# Patient Record
Sex: Female | Born: 1985 | Race: Black or African American | Hispanic: No | Marital: Single | Smoking: Never smoker
Health system: Southern US, Community
[De-identification: ages and names within clinical notes are randomized; demographics above are authoritative.]

## PROBLEM LIST (undated history)

## (undated) DIAGNOSIS — F411 Generalized anxiety disorder: Secondary | ICD-10-CM

## (undated) DIAGNOSIS — E785 Hyperlipidemia, unspecified: Secondary | ICD-10-CM

## (undated) DIAGNOSIS — Z789 Other specified health status: Secondary | ICD-10-CM

## (undated) HISTORY — DX: Generalized anxiety disorder: F41.1

## (undated) HISTORY — DX: Other specified health status: Z78.9

## (undated) HISTORY — DX: Hyperlipidemia, unspecified: E78.5

---

## 2021-05-04 ENCOUNTER — Emergency Department (HOSPITAL_BASED_OUTPATIENT_CLINIC_OR_DEPARTMENT_OTHER): Payer: Commercial Managed Care - PPO

## 2021-05-04 ENCOUNTER — Other Ambulatory Visit: Payer: Self-pay

## 2021-05-04 ENCOUNTER — Encounter (HOSPITAL_BASED_OUTPATIENT_CLINIC_OR_DEPARTMENT_OTHER): Payer: Self-pay

## 2021-05-04 ENCOUNTER — Emergency Department (HOSPITAL_BASED_OUTPATIENT_CLINIC_OR_DEPARTMENT_OTHER)
Admission: EM | Admit: 2021-05-04 | Discharge: 2021-05-04 | Disposition: A | Discharge status: Home / Self Care | Payer: Commercial Managed Care - PPO | Attending: Emergency Medicine | Admitting: Emergency Medicine

## 2021-05-04 DIAGNOSIS — R102 Pelvic and perineal pain: Secondary | ICD-10-CM | POA: Diagnosis not present

## 2021-05-04 DIAGNOSIS — R1031 Right lower quadrant pain: Secondary | ICD-10-CM | POA: Diagnosis present

## 2021-05-04 DIAGNOSIS — N898 Other specified noninflammatory disorders of vagina: Secondary | ICD-10-CM | POA: Diagnosis not present

## 2021-05-04 LAB — URINALYSIS, ROUTINE W REFLEX MICROSCOPIC
Bilirubin Urine: NEGATIVE
Glucose, UA: NEGATIVE mg/dL
Ketones, ur: 40 mg/dL — AB
Leukocytes,Ua: NEGATIVE
Nitrite: NEGATIVE
Protein, ur: NEGATIVE mg/dL
Specific Gravity, Urine: 1.025 (ref 1.005–1.030)
pH: 5.5 (ref 5.0–8.0)

## 2021-05-04 LAB — WET PREP, GENITAL
Clue Cells Wet Prep HPF POC: NONE SEEN
Sperm: NONE SEEN
Trich, Wet Prep: NONE SEEN
Yeast Wet Prep HPF POC: NONE SEEN

## 2021-05-04 LAB — URINALYSIS, MICROSCOPIC (REFLEX): WBC, UA: NONE SEEN WBC/hpf (ref 0–5)

## 2021-05-04 LAB — COMPREHENSIVE METABOLIC PANEL
ALT: 16 U/L (ref 0–44)
AST: 19 U/L (ref 15–41)
Albumin: 4.6 g/dL (ref 3.5–5.0)
Alkaline Phosphatase: 62 U/L (ref 38–126)
Anion gap: 9 (ref 5–15)
BUN: 10 mg/dL (ref 6–20)
CO2: 25 mmol/L (ref 22–32)
Calcium: 9.5 mg/dL (ref 8.9–10.3)
Chloride: 101 mmol/L (ref 98–111)
Creatinine, Ser: 0.81 mg/dL (ref 0.44–1.00)
GFR, Estimated: >60 mL/min (ref >60)
Glucose, Bld: 95 mg/dL (ref 70–99)
Potassium: 3.8 mmol/L (ref 3.5–5.1)
Sodium: 135 mmol/L (ref 135–145)
Total Bilirubin: 0.7 mg/dL (ref 0.3–1.2)
Total Protein: 8.3 g/dL — ABNORMAL HIGH (ref 6.5–8.1)

## 2021-05-04 LAB — CBC
HCT: 40.2 % (ref 36.0–46.0)
Hemoglobin: 12.9 g/dL (ref 12.0–15.0)
MCH: 27.7 pg (ref 26.0–34.0)
MCHC: 32.1 g/dL (ref 30.0–36.0)
MCV: 86.5 fL (ref 80.0–100.0)
Platelets: 283 10*3/uL (ref 150–400)
RBC: 4.65 MIL/uL (ref 3.87–5.11)
RDW: 12.7 % (ref 11.5–15.5)
WBC: 12 10*3/uL — ABNORMAL HIGH (ref 4.0–10.5)
nRBC: 0 % (ref 0.0–0.2)

## 2021-05-04 LAB — PREGNANCY, URINE: Preg Test, Ur: NEGATIVE

## 2021-05-04 LAB — LIPASE, BLOOD: Lipase: 24 U/L (ref 11–51)

## 2021-05-04 MED ORDER — HYDROCODONE-ACETAMINOPHEN 5-325 MG PO TABS: 1.0000 | ORAL_TABLET | Freq: Four times a day (QID) | ORAL | 0 refills | Status: DC | PRN

## 2021-05-04 NOTE — ED Triage Notes (Signed)
Pt abd pain x 3 days-denies n/v/d-mucus stool x 1 today -NAD-steady gait

## 2021-05-04 NOTE — ED Provider Notes (Addendum)
[facility] EMERGENCY DEPARTMENT Provider Note   CSN: [id number] Arrival date & time: 05/04/21  1515     History Chief Complaint  Patient presents with   Abdominal Pain    Kathleen Tanner is a 35 y.o. female.  HPI Patient is a 35 year old female who presents to the emergency department due to lower abdominal pain.  States her symptoms started about 3 days ago.  States that they are initially mild but have been progressively worsening.  Reports intermittent bouts of sharp pain in the pelvic region.  No known modifying factors.  Denies any nausea, vomiting, or diarrhea.  States that she had a normal bowel movement earlier today and noticed mucus when wiping.  Reports vaginal discharge but states that she believes this is her baseline discharge and has not acutely worsened.  Denies any dysuria or hematuria.  No fevers or chills.  Last menstrual period was about 2 weeks ago and was normal.  Denies any sexual activity for the past year.    History reviewed. No pertinent past medical history.  There are no problems to display for this patient.   Past Surgical History:  Procedure Laterality Date   CESAREAN SECTION       OB History   No obstetric history on file.     No family history on file.  Social History   Tobacco Use   Smoking status: Never   Smokeless tobacco: Never  Vaping Use   Vaping Use: Never used  Substance Use Topics   Alcohol use: Yes    Comment: daily   Drug use: Never    Home Medications Prior to Admission medications   Medication Sig Start Date End Date Taking? Authorizing Provider  HYDROcodone-acetaminophen (NORCO/VICODIN) 5-325 MG tablet Take 1 tablet by mouth every 6 (six) hours as needed. 05/04/21  Yes Placido Sou, PA-C    Allergies    Ciprofloxacin, Naproxen, and Tape  Review of Systems   Review of Systems  All other systems reviewed and are negative. Ten systems reviewed and are negative for acute change, except as noted in the  HPI.   Physical Exam Updated Vital Signs BP (!) 144/97 (BP Location: Right Arm)   Pulse 69   Temp 98.4 F (36.9 C) (Oral)   Resp 20   Ht 5' 0.5" (1.537 m)   Wt 73.5 kg   LMP 04/21/2021   SpO2 100%   BMI 31.12 kg/m   Physical Exam Vitals and nursing note reviewed.  Constitutional:      General: She is not in acute distress.    Appearance: Normal appearance. She is well-developed. She is not ill-appearing, toxic-appearing or diaphoretic.  HENT:     Head: Normocephalic and atraumatic.     Right Ear: External ear normal.     Left Ear: External ear normal.     Nose: Nose normal.     Mouth/Throat:     Mouth: Mucous membranes are moist.     Pharynx: Oropharynx is clear. No oropharyngeal exudate or posterior oropharyngeal erythema.  Eyes:     Extraocular Movements: Extraocular movements intact.  Cardiovascular:     Rate and Rhythm: Normal rate and regular rhythm.     Pulses: Normal pulses.     Heart sounds: Normal heart sounds. No murmur heard.   No friction rub. No gallop.  Pulmonary:     Effort: Pulmonary effort is normal. No respiratory distress.     Breath sounds: Normal breath sounds. No stridor. No wheezing, rhonchi or rales.  Abdominal:     General: Abdomen is flat.     Palpations: Abdomen is soft.     Tenderness: There is abdominal tenderness in the right lower quadrant, suprapubic area and left lower quadrant.     Comments: Abdomen is flat and soft.  Moderate tenderness noted diffusely across the lower abdomen that appears to be worst along the left lower quadrant.  Genitourinary:    Comments: Female nursing chaperone present.  Normal-appearing vulvar anatomy.  No rashes or lesions.  Normal-appearing vaginal mucosa.  Closed cervical os.  Moderate amount of white discharge noted in the vaginal vault.  No increased friability noted along the cervix.  No cervical motion tenderness.  No adnexal tenderness. Musculoskeletal:        General: Normal range of motion.      Cervical back: Normal range of motion and neck supple. No tenderness.  Skin:    General: Skin is warm and dry.  Neurological:     General: No focal deficit present.     Mental Status: She is alert and oriented to person, place, and time.  Psychiatric:        Mood and Affect: Mood normal.        Behavior: Behavior normal.    ED Results / Procedures / Treatments   Labs (all labs ordered are listed, but only abnormal results are displayed) Labs Reviewed  WET PREP, GENITAL - Abnormal; Notable for the following components:      Result Value   WBC, Wet Prep HPF POC MANY (*)    All other components within normal limits  COMPREHENSIVE METABOLIC PANEL - Abnormal; Notable for the following components:   Total Protein 8.3 (*)    All other components within normal limits  CBC - Abnormal; Notable for the following components:   WBC 12.0 (*)    All other components within normal limits  URINALYSIS, ROUTINE W REFLEX MICROSCOPIC - Abnormal; Notable for the following components:   Hgb urine dipstick SMALL (*)    Ketones, ur 40 (*)    All other components within normal limits  URINALYSIS, MICROSCOPIC (REFLEX) - Abnormal; Notable for the following components:   Bacteria, UA RARE (*)    All other components within normal limits  LIPASE, BLOOD  PREGNANCY, URINE  GC/CHLAMYDIA PROBE AMP ([place]) NOT AT [facility]   EKG None  Radiology US PELVIC COMPLETE W TRANSVAGINAL AND TORSION R/O  Result Date: 05/04/2021 CLINICAL DATA:  Left-sided pelvic pain EXAM: TRANSABDOMINAL AND TRANSVAGINAL ULTRASOUND OF PELVIS DOPPLER ULTRASOUND OF OVARIES TECHNIQUE: Both transabdominal and transvaginal ultrasound examinations of the pelvis were performed. Transabdominal technique was performed for global imaging of the pelvis including uterus, ovaries, adnexal regions, and pelvic cul-de-sac. It was necessary to proceed with endovaginal exam following the transabdominal exam to visualize the endometrium. Color and duplex  Doppler ultrasound was utilized to evaluate blood flow to the ovaries. COMPARISON:  None. FINDINGS: Uterus Measurements: 8.4 x 3.9 x 5.8 cm = volume: 100 mL. The uterus is anteverted. No intrauterine masses are seen. Defect is seen within the lower anterior uterine segment in keeping with prior Caesarean section. The cervix contains a few small nabothian cysts, but is otherwise unremarkable. Endometrium Thickness: 4 mm.  No focal abnormality visualized. Right ovary Measurements: 3.2 x 2.3 x 2.5 cm = volume: 9 mL. Normal appearance/no adnexal mass. Left ovary Measurements: 3.3 x 1.7 x 2.5 cm = volume: 7 mL. Normal appearance/no adnexal mass. Pulsed Doppler evaluation of both ovaries demonstrates normal low-resistance arterial  and venous waveforms. Other findings A trace amount of simple appearing free fluid is seen within the cul-de-sac and within the right adnexa. IMPRESSION: Trace free fluid, possibly physiologic. Otherwise normal pelvic sonogram. Electronically Signed   By: Helyn Numbers M.D.   On: 05/04/2021 18:58    Procedures Procedures   Medications Ordered in ED Medications - No data to display  ED Course  I have reviewed the triage vital signs and the nursing notes.  Pertinent labs & imaging results that were available during my care of the patient were reviewed by me and considered in my medical decision making (see chart for details).    MDM Rules/Calculators/A&P                          Pt is a 35 y.o. female who presents to the emergency department due to pelvic pain.  Labs: CBC with a white count of 12. CMP with a total protein of 8.3. UA with small hemoglobin, 40 ketones, rare bacteria. Pregnancy test is negative. Wet prep showing many white blood cells. GC/chlamydia is pending.  Imaging: Pelvic ultrasound showing trace free fluid, possibly physiologic.  Otherwise normal pelvic sonogram.  I, Placido Sou, PA-C, personally reviewed and evaluated these images and lab  results as part of my medical decision-making.  Unsure the source of the patient's symptoms.  Wet prep does show many white blood cells.  Patient with no cervical motion tenderness or adnexal tenderness.  Pelvic sonogram appears reassuring.  She denies any sexual activity for the past year.  Doubt PID.  Doubt ovarian torsion.  Will discharge on a course of Norco for breakthrough symptoms.  Discussed safety regarding this medication.  Recommended patient follow-up with her gynecologist regarding her symptoms today.  Patient understands to return to the emergency department for treatment if she finds that her gonorrhea or chlamydia test are positive.  Feel that she is stable for discharge at this time and she is agreeable.  Discussed return precautions.  Her questions were answered and she was amicable at the time of discharge.  Note: Portions of this report may have been transcribed using voice recognition software. Every effort was made to ensure accuracy; however, inadvertent computerized transcription errors may be present.   Final Clinical Impression(s) / ED Diagnoses Final diagnoses:  Pelvic pain in female   Rx / DC Orders ED Discharge Orders          Ordered    HYDROcodone-acetaminophen (NORCO/VICODIN) 5-325 MG tablet  Every 6 hours PRN        05/04/21 1918             Placido Sou, PA-C 05/04/21 1921    Placido Sou, PA-C 05/04/21 1944    Benjiman Core, MD 05/05/21 0001

## 2021-05-04 NOTE — Discharge Instructions (Addendum)
I have prescribed you a strong narcotic called Vicodin. Please only take this as prescribed. This medication also has tylenol in it, so please be sure you are not taking more than 3000 mg of tylenol per day. Do not drive or operate heavy machinery after taking this medication. Do not mix it with alcohol.   If you develop any new or worsening symptoms please come back to the emergency department immediately.  It was a pleasure to meet you.

## 2021-05-04 NOTE — ED Notes (Signed)
US being done 

## 2021-05-06 LAB — GC/CHLAMYDIA PROBE AMP (~~LOC~~) NOT AT ARMC
Chlamydia: NEGATIVE
Comment: NEGATIVE
Comment: NORMAL
Neisseria Gonorrhea: NEGATIVE

## 2021-05-21 ENCOUNTER — Ambulatory Visit: Payer: Self-pay | Admitting: Family Medicine

## 2021-06-11 ENCOUNTER — Ambulatory Visit (INDEPENDENT_AMBULATORY_CARE_PROVIDER_SITE_OTHER): Payer: Commercial Managed Care - PPO | Admitting: Family Medicine

## 2021-06-11 ENCOUNTER — Other Ambulatory Visit: Payer: Self-pay

## 2021-06-11 ENCOUNTER — Encounter: Payer: Self-pay | Admitting: Family Medicine

## 2021-06-11 VITALS — BP 135/83 | HR 97 | Temp 98.3°F | Ht 62.0 in | Wt 167.8 lb

## 2021-06-11 DIAGNOSIS — F411 Generalized anxiety disorder: Secondary | ICD-10-CM

## 2021-06-11 DIAGNOSIS — G4700 Insomnia, unspecified: Secondary | ICD-10-CM | POA: Diagnosis not present

## 2021-06-11 DIAGNOSIS — M25512 Pain in left shoulder: Secondary | ICD-10-CM | POA: Diagnosis not present

## 2021-06-11 MED ORDER — TRAZODONE HCL 50 MG PO TABS: 25.0000 mg | ORAL_TABLET | Freq: Every evening | ORAL | 3 refills | Status: DC | PRN

## 2021-06-11 MED ORDER — MELOXICAM 15 MG PO TABS: 15.0000 mg | ORAL_TABLET | Freq: Every day | ORAL | 0 refills | Status: DC

## 2021-06-11 NOTE — Progress Notes (Signed)
Chief Complaint  Patient presents with   Establish Care    NP. Est care       New Patient Visit SUBJECTIVE: HPI: Kathleen Tanner is an 35 y.o.female who is being seen for establishing care.   Onset:  1 month ago. No inj or change in activity.  Location: anterior L shoulder Character:  aching and sharp  Progression of issue:  has worsened Associated symptoms: decreased ROM No bruising, redness, swelling, locking.  Treatment: to date has been OTC NSAIDS and trying to work it out.   Neurovascular symptoms: no  3-4 mo of insomnia. Will have trouble falling asleep, wakes up q hr, around 14 times per night. Will have issues falling asleep. Has therapist for this. Has done CBT. Tried melatonin without relief. No blue light prior to bed. A cup of coffee daily. Alcohol on the weekends this is a daily issue. Walks and plays with kids routinely. Was on Lexapro 5 mg/d and it was not helpful. Went up to 10 mg/d and it didn't help.   Past Medical History:  Diagnosis Date   No known health problems    Past Surgical History:  Procedure Laterality Date   CESAREAN SECTION     History reviewed. No pertinent family history. Allergies  Allergen Reactions   Ciprofloxacin    Naproxen    Tape     Current Outpatient Medications:    meloxicam (MOBIC) 15 MG tablet, Take 1 tablet (15 mg total) by mouth daily., Disp: 30 tablet, Rfl: 0   Multiple Vitamins-Minerals (MULTIVITAMIN WOMEN PO), Take by mouth., Disp: , Rfl:    traZODone (DESYREL) 50 MG tablet, Take 0.5-1 tablets (25-50 mg total) by mouth at bedtime as needed for sleep., Disp: 30 tablet, Rfl: 3   ESTARYLLA 0.25-35 MG-MCG tablet, Take 1 tablet by mouth daily., Disp: , Rfl:   OBJECTIVE: BP 135/83   Pulse 97   Temp 98.3 F (36.8 C) (Oral)   Ht 5\' 2"  (1.575 m)   Wt 167 lb 12.8 oz (76.1 kg)   LMP 06/08/2021 (Approximate)   SpO2 100%   BMI 30.69 kg/m  General:  well developed, well nourished, in no apparent distress Lungs:  clear to  auscultation, breath sounds equal bilaterally, no respiratory distress Cardio:  regular rate and rhythm, no LE edema or bruits Musculoskeletal: L shoulder: slight pain w Neer's at end of ROM; +Speed's, +TTP over coracoid process; neg Hawkins, lift off, cross over, empty can Neuro:  gait normal, biceps reflex 1/4 b/l, no clonus Psych: well oriented with normal range of affect and appropriate judgment/insight  ASSESSMENT/PLAN: Acute pain of left shoulder  GAD (generalized anxiety disorder)  Insomnia, unspecified type  New problem. Likely component of AC and biceps tendinitis.  Heat, ice, Mobic, Tylenol, stretches and exercises.  PT if no improvement in the next month. Continue following with the counseling team.  Exercise recommended. Chronic, uncontrolled.  Sleep hygiene information provided.  Trazodone 25 to 50 mg nightly as needed. Patient should return in 6 weeks to recheck and for physical. The patient voiced understanding and agreement to the plan.   13/08/2020 [place], DO 06/11/21  5:04 PM

## 2021-06-11 NOTE — Patient Instructions (Addendum)
Heat (pad or rice pillow in microwave) over affected area, 10-15 minutes twice daily.   Ice/cold pack over area for 10-15 min twice daily.  OK to take Tylenol 1000 mg (2 extra strength tabs) or 975 mg (3 regular strength tabs) every 6 hours as needed.  Sleep Hygiene Tips: Do not watch TV or look at screens within 1 hour of going to bed. If you do, make sure there is a blue light filter (nighttime mode) involved. Try to go to bed around the same time every night. Wake up at the same time within 1 hour of regular time. Ex: If you wake up at 7 AM for work, do not sleep past 8 AM on days that you don't work. Do not drink alcohol before bedtime. Do not consume caffeine-containing beverages after noon or within 9 hours of intended bedtime. Get regular exercise/physical activity in your life, but not within 2 hours of planned bedtime. Do not take naps.  Do not eat within 2 hours of planned bedtime. Melatonin, 5-10 mg 30-60 minutes before planned bedtime may be helpful.  The bed should be for sleep or sex only. If after 20-30 minutes you are unable to fall asleep, get up and do something relaxing. Do this until you feel ready to go to sleep again.   Let us know if you need anything.  Biceps Tendon Disruption (Proximal) Rehab Do exercises exactly as told by your health care provider and adjust them as directed. It is normal to feel mild stretching, pulling, tightness, or discomfort as you do these exercises, but you should stop right away if you feel sudden pain or your pain gets worse.  Stretching and range of motion exercises These exercises warm up your muscles and joints and improve the movement and flexibility of your arm and shoulder. These exercises also help to relieve pain and stiffness. Exercise A: Shoulder flexion, standing   Stand facing a wall. Put your left / right hand on the wall. Slide your left / right hand up the wall. Stop when you feel a stretch in your shoulder, or when you  reach the angle recommended by your health care provider. Use your other hand to help raise your arm, if needed. As your hand gets higher, you may need to step closer to the wall. Avoid shrugging your shoulder while you raise your arm. To do this, keep your shoulder blade tucked down toward your spine. Hold for 30 seconds. Slowly return to the starting position. Use your other arm to help, if needed. Repeat 2 times. Complete this exercise 3 times per week. Exercise B: Pendulum   Stand near a wall or a surface that you can hold onto for balance. Bend at the waist and let your left / right arm hang straight down. Use your other arm to support you. Relax your arm and shoulder muscles, and move your hips and your trunk so your left / right arm swings freely. Your arm should swing because of the motion of your body, not because you are using your arm or shoulder muscles. Keep moving so your arm swings in the following directions, as told by your health care provider: Side to side. Forward and backward. In clockwise and counterclockwise circles. Slowly return to the starting position. Repeat 2 times. Complete this exercise 3 times per week.  Strengthening exercises These exercises build strength and endurance in your arm and shoulder. Endurance is the ability to use your muscles for a long time, even after your  muscles get tired. Exercise C: Elbow flexion, neutral  Sit on a stable chair without armrests, or stand. Hold a 3-5 lb weight in your left / right hand, or hold an exercise band with both hands. Your palms should face each other at the starting position. Bend your left / right elbow and move your hand up toward your shoulder. Lead with your thumb, and keep your palm facing the same direction. Keep your other arm straight down, in the starting position. Slowly return to the starting position. Repeat 2-3 times. Complete this exercise 3 times per week. Exercise D: Forearm supination    Sit with your left / right forearm on a table. Your elbow should be below shoulder height. Rest your hand over the edge of the table so your palm faces down. If directed, hold a hammer with your left / right hand. Without moving your elbow, slowly rotate your hand so your palm faces up toward the ceiling. If you are holding a hammer, begin by holding the hammer near the head. When this exercise gets easier for you, hold the hammer farther down the handle. Hold for 3 seconds. Slowly return to the starting position. Repeat 2 times. Complete this exercise 3 times per week. Exercise E: Scapular retraction   Sit in a stable chair without armrests, or stand. Secure an exercise band to a stable object in front of you so the band is at shoulder height. Hold one end of the exercise band in each hand. Squeeze your shoulder blades together and move your elbows slightly behind you. Do not shrug your shoulders. Hold for 3 seconds. Slowly return to the starting position. Repeat 2 times. Complete this exercise 3 times per week. Exercise F: Scapular protraction, supine   Lie on your back on a firm surface. Hold a 3-5 lb weight in your left / right hand. Raise your left / right arm straight into the air so your hand is directly above your shoulder joint. Push the weight into the air so your shoulder lifts off of the surface that you are lying on. Do not move your head, neck, or back. Hold for 3 seconds. Slowly return to the starting position. Let your muscles relax completely before you repeat this exercise. Repeat 2 times. Complete this exercise 3 times per week. This information is not intended to replace advice given to you by your health care provider. Make sure you discuss any questions you have with your health care provider. Document Released: 07/25/2005 Document Revised: 03/31/2016 Document Reviewed: 07/03/2015 Elsevier Interactive Patient Education  2017 [employer].

## 2021-06-17 ENCOUNTER — Encounter: Payer: Self-pay | Admitting: Family Medicine

## 2021-06-30 ENCOUNTER — Encounter: Payer: Self-pay | Admitting: Family Medicine

## 2021-07-23 ENCOUNTER — Ambulatory Visit (INDEPENDENT_AMBULATORY_CARE_PROVIDER_SITE_OTHER): Payer: Commercial Managed Care - PPO | Admitting: Family Medicine

## 2021-07-23 ENCOUNTER — Encounter: Payer: Self-pay | Admitting: Family Medicine

## 2021-07-23 VITALS — BP 110/78 | HR 75 | Temp 98.1°F | Ht 61.0 in | Wt 170.2 lb

## 2021-07-23 DIAGNOSIS — Z0000 Encounter for general adult medical examination without abnormal findings: Secondary | ICD-10-CM | POA: Diagnosis not present

## 2021-07-23 DIAGNOSIS — Z1159 Encounter for screening for other viral diseases: Secondary | ICD-10-CM

## 2021-07-23 LAB — CBC
HCT: 36.6 % (ref 36.0–46.0)
Hemoglobin: 11.6 g/dL — ABNORMAL LOW (ref 12.0–15.0)
MCHC: 31.7 g/dL (ref 30.0–36.0)
MCV: 85.7 fl (ref 78.0–100.0)
Platelets: 236 10*3/uL (ref 150.0–400.0)
RBC: 4.27 Mil/uL (ref 3.87–5.11)
RDW: 13.3 % (ref 11.5–15.5)
WBC: 10.2 10*3/uL (ref 4.0–10.5)

## 2021-07-23 LAB — COMPREHENSIVE METABOLIC PANEL
ALT: 22 U/L (ref 0–35)
AST: 35 U/L (ref 0–37)
Albumin: 4.1 g/dL (ref 3.5–5.2)
Alkaline Phosphatase: 43 U/L (ref 39–117)
BUN: 11 mg/dL (ref 6–23)
CO2: 27 mEq/L (ref 19–32)
Calcium: 9.4 mg/dL (ref 8.4–10.5)
Chloride: 104 mEq/L (ref 96–112)
Creatinine, Ser: 0.98 mg/dL (ref 0.40–1.20)
GFR: 74.36 mL/min (ref >60.00)
Glucose, Bld: 76 mg/dL (ref 70–99)
Potassium: 4.2 mEq/L (ref 3.5–5.1)
Sodium: 138 mEq/L (ref 135–145)
Total Bilirubin: 0.4 mg/dL (ref 0.2–1.2)
Total Protein: 7 g/dL (ref 6.0–8.3)

## 2021-07-23 LAB — LIPID PANEL
Cholesterol: 170 mg/dL (ref 0–200)
HDL: 64.5 mg/dL (ref >39.00)
LDL Cholesterol: 89 mg/dL (ref 0–99)
NonHDL: 105.21
Total CHOL/HDL Ratio: 3
Triglycerides: 83 mg/dL (ref 0.0–149.0)
VLDL: 16.6 mg/dL (ref 0.0–40.0)

## 2021-07-23 MED ORDER — DOXEPIN HCL 10 MG PO CAPS: 10.0000 mg | ORAL_CAPSULE | Freq: Every evening | ORAL | 2 refills | Status: DC | PRN

## 2021-07-23 NOTE — Progress Notes (Signed)
Chief Complaint  Patient presents with   Annual Exam     Well Woman Kathleen Tanner is here for a complete physical.   Her last physical was >1 year ago.  Current diet: in general, a "healthy" diet. Current exercise: kick boxing 3x/week.  Fatigue out of ordinary? No Seatbelt? Yes Advanced directive? No  Health Maintenance Pap/HPV- Yes Tetanus- Yes HIV screening- Yes Hep C screening- No  Past Medical History:  Diagnosis Date   No known health problems      Past Surgical History:  Procedure Laterality Date   CESAREAN SECTION      Medications  Current Outpatient Medications on File Prior to Visit  Medication Sig Dispense Refill   ESTARYLLA 0.25-35 MG-MCG tablet Take 1 tablet by mouth daily.     meloxicam (MOBIC) 15 MG tablet Take 1 tablet (15 mg total) by mouth daily. 30 tablet 0   Multiple Vitamins-Minerals (MULTIVITAMIN WOMEN PO) Take by mouth.     traZODone (DESYREL) 50 MG tablet Take 0.5-1 tablets (25-50 mg total) by mouth at bedtime as needed for sleep. 30 tablet 3   Allergies Allergies  Allergen Reactions   Ciprofloxacin    Naproxen    Tape     Review of Systems: Constitutional:  no unexpected weight changes Eye:  no recent significant change in vision Ear/Nose/Mouth/Throat:  Ears:  no tinnitus or vertigo and no recent change in hearing Nose/Mouth/Throat:  no complaints of nasal congestion, no sore throat Cardiovascular: no chest pain Respiratory:  no cough and no shortness of breath Gastrointestinal:  no abdominal pain, no change in bowel habits GU:  Female: negative for dysuria or pelvic pain Musculoskeletal/Extremities:  no pain of the joints Integumentary (Skin/Breast):  no abnormal skin lesions reported Neurologic:  no headaches Endocrine:  denies fatigue Hematologic/Lymphatic:  No areas of easy bleeding  Exam BP 110/78    Pulse 75    Temp 98.1 F (36.7 C) (Oral)    Ht 5\' 1"  (1.549 m)    Wt 170 lb 4 oz (77.2 kg)    SpO2 99%    BMI 32.17 kg/m   General:  well developed, well nourished, in no apparent distress Skin:  no significant moles, warts, or growths Head:  no masses, lesions, or tenderness Eyes:  pupils equal and round, sclera anicteric without injection Ears:  canals without lesions, TMs shiny without retraction, no obvious effusion, no erythema Nose:  nares patent, septum midline, mucosa normal, and no drainage or sinus tenderness Throat/Pharynx:  lips and gingiva without lesion; tongue and uvula midline; non-inflamed pharynx; no exudates or postnasal drainage Neck: neck supple without adenopathy, thyromegaly, or masses Lungs:  clear to auscultation, breath sounds equal bilaterally, no respiratory distress Cardio:  regular rate and rhythm, no bruits, no LE edema Abdomen:  abdomen soft, nontender; bowel sounds normal; no masses or organomegaly Genital: Defer to GYN Musculoskeletal:  symmetrical muscle groups noted without atrophy or deformity Extremities:  no clubbing, cyanosis, or edema, no deformities, no skin discoloration Neuro:  gait normal; deep tendon reflexes normal and symmetric Psych: well oriented with normal range of affect and appropriate judgment/insight  Assessment and Plan  Well adult exam - Plan: CBC, Comprehensive metabolic panel, Lipid panel  Encounter for hepatitis C screening test for low risk patient - Plan: Hepatitis C antibody   Well 35 y.o. female. Counseled on diet and exercise. Other orders as above. Advanced directive form given. Politely declined flu shot today, will get when she returns from vacation.  Covid bivalent  booster rec'd.   Follow up in 6 mo, 1 mo if not doing well w Doxepin. The patient voiced understanding and agreement to the plan.  Jilda Roche [place], DO 07/23/21 1:12 PM

## 2021-07-23 NOTE — Patient Instructions (Addendum)
Give [place] 2-3 business days to get the results of your labs back.   Keep the diet clean and stay active.  I recommend getting the updated bivalent covid vaccination booster at your convenience.   Let me know if there are cost issues with the new medicine.   Let us know if you need anything.

## 2021-07-26 ENCOUNTER — Other Ambulatory Visit: Payer: Commercial Managed Care - PPO

## 2021-07-26 DIAGNOSIS — D649 Anemia, unspecified: Secondary | ICD-10-CM

## 2021-07-26 LAB — HEPATITIS C ANTIBODY
Hepatitis C Ab: NONREACTIVE
SIGNAL TO CUT-OFF: <0.02 (ref <1.00)

## 2021-07-27 LAB — IRON,TIBC AND FERRITIN PANEL
%SAT: 15 % (calc) — ABNORMAL LOW (ref 16–45)
Ferritin: 15 ng/mL — ABNORMAL LOW (ref 16–154)
Iron: 58 ug/dL (ref 40–190)
TIBC: 398 mcg/dL (calc) (ref 250–450)

## 2022-01-21 ENCOUNTER — Ambulatory Visit (INDEPENDENT_AMBULATORY_CARE_PROVIDER_SITE_OTHER): Payer: Commercial Managed Care - PPO | Admitting: Family Medicine

## 2022-01-21 ENCOUNTER — Encounter: Payer: Self-pay | Admitting: Family Medicine

## 2022-01-21 VITALS — BP 122/80 | HR 84 | Temp 97.4°F | Ht 61.0 in | Wt 158.5 lb

## 2022-01-21 DIAGNOSIS — L309 Dermatitis, unspecified: Secondary | ICD-10-CM | POA: Diagnosis not present

## 2022-01-21 DIAGNOSIS — G4700 Insomnia, unspecified: Secondary | ICD-10-CM | POA: Diagnosis not present

## 2022-01-21 DIAGNOSIS — Z23 Encounter for immunization: Secondary | ICD-10-CM

## 2022-01-21 DIAGNOSIS — F411 Generalized anxiety disorder: Secondary | ICD-10-CM | POA: Diagnosis not present

## 2022-01-21 MED ORDER — TRIAMCINOLONE ACETONIDE 0.1 % EX CREA: 1.0000 | TOPICAL_CREAM | Freq: Two times a day (BID) | CUTANEOUS | 1 refills | Status: AC

## 2022-01-21 MED ORDER — CITALOPRAM HYDROBROMIDE 20 MG PO TABS: ORAL_TABLET | ORAL | 3 refills | Status: DC

## 2022-01-21 NOTE — Progress Notes (Signed)
Chief Complaint  Patient presents with   Follow-up    6 month    Subjective: Patient is a 36 y.o. female here for f/u.  Patient continues to struggle with insomnia.  Falling asleep is not as big of an issue as waking up and not being able to fall back asleep.  She has tried doxepin and trazodone.  She was compliant with both for several weeks before stopping due to them being ineffective.  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  She is not particularly interested in anything that may be addictive or habit forming.  She has never been on any other medications.  She recognizes that racing thoughts contribute to her inability to fall back asleep.  She has been kickboxing routinely and is in the process of losing weight.  Past Medical History:  Diagnosis Date   No known health problems     Objective: BP 122/80   Pulse 84   Temp (!) 97.4 F (36.3 C) (Oral)   Ht 5\' 1"  (1.549 m)   Wt 158 lb 8 oz (71.9 kg)   SpO2 99%   BMI 29.95 kg/m  General: Awake, appears stated age Lungs:  No accessory muscle use Psych: Age appropriate judgment and insight, normal affect and mood  Assessment and Plan: GAD (generalized anxiety disorder) - Plan: citalopram (CELEXA) 20 MG tablet  Insomnia, unspecified type - Plan: citalopram (CELEXA) 20 MG tablet  Eczema, unspecified type  Need for Tdap vaccination - Plan: Tdap vaccine greater than or equal to 7yo IM  Chronic, uncontrolled.  Start Celexa 10 mg daily for 2 weeks and then increase to 20 mg daily.  Continue good sleep hygiene.  I will see her in 6 weeks to recheck this.  Could consider mirtazapine but this would cause weight gain.  Could consider TCA or atypical antipsychotic.  Could consider orexin inhibitor. The patient voiced understanding and agreement to the plan.  [place], DO 01/21/22  1:29 PM

## 2022-01-21 NOTE — Patient Instructions (Addendum)
Continue with good sleep hygiene.   Don't use the cream on the face or in any orifice.   Let us know if you need anything.

## 2022-02-25 ENCOUNTER — Encounter (INDEPENDENT_AMBULATORY_CARE_PROVIDER_SITE_OTHER): Payer: Commercial Managed Care - PPO | Admitting: Family Medicine

## 2022-02-25 DIAGNOSIS — B3731 Acute candidiasis of vulva and vagina: Secondary | ICD-10-CM | POA: Diagnosis not present

## 2022-02-25 MED ORDER — FLUCONAZOLE 150 MG PO TABS: ORAL_TABLET | ORAL | 0 refills | Status: DC

## 2022-02-25 NOTE — Telephone Encounter (Signed)
Please see the MyChart message reply(ies) for my assessment and plan.  The patient gave consent for this Medical Advice Message and is aware that it may result in a bill to their insurance company as well as the possibility that this may result in a co-payment or deductible. They are an established patient, but are not seeking medical advice exclusively about a problem treated during an in person or video visit in the last 7 days. I did not recommend an in person or video visit within 7 days of my reply.  I spent a total of 6 minutes cumulative time within 7 days through MyChart messaging Kennedey Digilio Paul Lyndel Sarate, DO  

## 2022-03-11 ENCOUNTER — Ambulatory Visit (INDEPENDENT_AMBULATORY_CARE_PROVIDER_SITE_OTHER): Payer: Commercial Managed Care - PPO | Admitting: Family Medicine

## 2022-03-11 ENCOUNTER — Encounter: Payer: Self-pay | Admitting: Family Medicine

## 2022-03-11 DIAGNOSIS — F411 Generalized anxiety disorder: Secondary | ICD-10-CM | POA: Diagnosis not present

## 2022-03-11 DIAGNOSIS — G4700 Insomnia, unspecified: Secondary | ICD-10-CM

## 2022-03-11 MED ORDER — DOXEPIN HCL 10 MG PO CAPS: 10.0000 mg | ORAL_CAPSULE | Freq: Every evening | ORAL | 2 refills | Status: DC | PRN

## 2022-03-11 MED ORDER — CITALOPRAM HYDROBROMIDE 20 MG PO TABS: 20.0000 mg | ORAL_TABLET | Freq: Every day | ORAL | 2 refills | Status: DC

## 2022-03-11 NOTE — Progress Notes (Signed)
Chief Complaint  Patient presents with   Follow-up    Subjective Kathleen Tanner presents for f/u anxiety.  Pt is currently being treated with Celexa 20 mg/d.  Reports improvement in anxiety since treatment. Has some teeth grinding but no other AE's.  Reports compliance. Still waking up in middle of night.  No thoughts of harming self or others. No self-medication with alcohol, prescription drugs or illicit drugs. Pt is following with a counselor/psychologist.  Past Medical History:  Diagnosis Date   No known health problems    Allergies as of 03/11/2022       Reactions   Ciprofloxacin    Naproxen    Tape         Medication List        Accurate as of March 11, 2022  3:01 PM. If you have any questions, ask your nurse or doctor.          citalopram 20 MG tablet Commonly known as: CELEXA Take 1 tablet (20 mg total) by mouth daily. What changed:  how much to take how to take this when to take this additional instructions Changed by: Sharlene Dory, DO   doxepin 10 MG capsule Commonly known as: SINEQUAN Take 1 capsule (10 mg total) by mouth at bedtime as needed. Started by: Sharlene Dory, DO   Estarylla 0.25-35 MG-MCG tablet Generic drug: norgestimate-ethinyl estradiol Take 1 tablet by mouth daily.   fluconazole 150 MG tablet Commonly known as: DIFLUCAN Take 1 tab, repeat in 72 hours if no improvement.   MULTIVITAMIN WOMEN PO Take by mouth.   triamcinolone cream 0.1 % Commonly known as: KENALOG Apply 1 Application topically 2 (two) times daily.        Exam BP 108/74   Pulse 78   Temp 98.2 F (36.8 C) (Oral)   Ht 5\' 1"  (1.549 m)   Wt 162 lb 8 oz (73.7 kg)   SpO2 99%   BMI 30.70 kg/m  General:  well developed, well nourished, in no apparent distress Lungs:  No respiratory distress Psych: well oriented with normal range of affect and age-appropriate judgement/insight, alert and oriented x4.  Assessment and Plan  GAD  (generalized anxiety disorder) - Plan: citalopram (CELEXA) 20 MG tablet  Insomnia, unspecified type - Plan: citalopram (CELEXA) 20 MG tablet, doxepin (SINEQUAN) 10 MG capsule  Chronic, not fully controlled.  Continue Celexa 20 mg daily.  Because she has 0 improvement with nighttime awakenings, we will add back doxepin 10 mg nightly to it.  She will send me a message in 2 weeks with her progress.  If she is doing well, f/u in 5 mo for CPE or prn.  If not, we will make a change in follow-up in a few more weeks following her message. The patient voiced understanding and agreement to the plan.  [place], DO 03/11/22 3:01 PM

## 2022-03-11 NOTE — Patient Instructions (Signed)
Send me a message in 2 weeks with how you are doing.   Take the doxepin at night. Continue the Celexa.   Stay active.  Let us know if you need anything.

## 2022-04-15 ENCOUNTER — Encounter: Payer: Self-pay | Admitting: Family Medicine

## 2022-04-18 ENCOUNTER — Other Ambulatory Visit: Payer: Self-pay | Admitting: Family Medicine

## 2022-04-18 MED ORDER — FLUCONAZOLE 150 MG PO TABS: ORAL_TABLET | ORAL | 0 refills | Status: DC

## 2022-05-26 ENCOUNTER — Other Ambulatory Visit: Payer: Self-pay | Admitting: Family Medicine

## 2022-05-26 DIAGNOSIS — G4700 Insomnia, unspecified: Secondary | ICD-10-CM

## 2022-08-19 ENCOUNTER — Ambulatory Visit (INDEPENDENT_AMBULATORY_CARE_PROVIDER_SITE_OTHER): Payer: Commercial Managed Care - PPO | Admitting: Family Medicine

## 2022-08-19 ENCOUNTER — Encounter: Payer: Self-pay | Admitting: Family Medicine

## 2022-08-19 VITALS — BP 124/74 | HR 64 | Temp 98.0°F | Resp 16 | Ht 61.0 in | Wt 181.4 lb

## 2022-08-19 DIAGNOSIS — Z0000 Encounter for general adult medical examination without abnormal findings: Secondary | ICD-10-CM | POA: Diagnosis not present

## 2022-08-19 NOTE — Progress Notes (Signed)
Chief Complaint  Patient presents with   Annual Exam    Annual Exam     Well Woman Kathleen Tanner is here for a complete physical.   Her last physical was >1 year ago.  Current diet: in general, a "healthy" diet. Current exercise: yoga. Contraception? Yes Fatigue out of ordinary? Yes Seatbelt? Yes Advanced directive? No  Health Maintenance Pap/HPV- Yes Tetanus- Yes HIV screening- Yes Hep C screening- Yes  Past Medical History:  Diagnosis Date   No known health problems      Past Surgical History:  Procedure Laterality Date   CESAREAN SECTION      Medications  Current Outpatient Medications on File Prior to Visit  Medication Sig Dispense Refill   citalopram (CELEXA) 20 MG tablet Take 1 tablet (20 mg total) by mouth daily. 90 tablet 2   doxepin (SINEQUAN) 10 MG capsule Take 1 capsule (10 mg total) by mouth at bedtime as needed. 30 capsule 2   Multiple Vitamins-Minerals (MULTIVITAMIN WOMEN PO) Take by mouth.     triamcinolone cream (KENALOG) 0.1 % Apply 1 Application topically 2 (two) times daily. 30 g 1   ESTARYLLA 0.25-35 MG-MCG tablet Take 1 tablet by mouth daily.     fluconazole (DIFLUCAN) 150 MG tablet Take 1 tab, repeat in 72 hours if no improvement. 2 tablet 0   Allergies Allergies  Allergen Reactions   Ciprofloxacin    Naproxen    Tape     Review of Systems: Constitutional:  no unexpected weight changes Eye:  no recent significant change in vision Ear/Nose/Mouth/Throat:  Ears:  no tinnitus or vertigo and no recent change in hearing Nose/Mouth/Throat:  no complaints of nasal congestion, no sore throat Cardiovascular: no chest pain Respiratory:  no cough and no shortness of breath Gastrointestinal:  no abdominal pain, no change in bowel habits GU:  Female: negative for dysuria or pelvic pain Musculoskeletal/Extremities:  no pain of the joints Integumentary (Skin/Breast):  no abnormal skin lesions reported Neurologic:  no headaches Endocrine:  +  fatigue Hematologic/Lymphatic:  No areas of easy bleeding  Exam BP 124/74 (BP Location: Right Arm, Patient Position: Sitting, Cuff Size: Normal)   Pulse 64   Temp 98 F (36.7 C) (Oral)   Resp 16   Ht 5\' 1"  (1.549 m)   Wt 181 lb 6.4 oz (82.3 kg)   SpO2 99%   BMI 34.28 kg/m  General:  well developed, well nourished, in no apparent distress Skin:  no significant moles, warts, or growths Head:  no masses, lesions, or tenderness Eyes:  pupils equal and round, sclera anicteric without injection Ears:  canals without lesions, TMs shiny without retraction, no obvious effusion, no erythema Nose:  nares patent, mucosa normal, and no drainage  Throat/Pharynx:  lips and gingiva without lesion; tongue and uvula midline; Mallampati 3; non-inflamed pharynx; no exudates or postnasal drainage Neck: neck supple without adenopathy, thyromegaly, or masses Lungs:  clear to auscultation, breath sounds equal bilaterally, no respiratory distress Cardio:  regular rate and rhythm, no bruits, no LE edema Abdomen:  abdomen soft, nontender; bowel sounds normal; no masses or organomegaly Genital: Defer to GYN Musculoskeletal:  symmetrical muscle groups noted without atrophy or deformity Extremities:  no clubbing, cyanosis, or edema, no deformities, no skin discoloration Neuro:  gait normal; deep tendon reflexes normal and symmetric Psych: well oriented with normal range of affect and appropriate judgment/insight  Assessment and Plan  Well adult exam - Plan: CBC, Comprehensive metabolic panel, Lipid panel, TSH   Well  37 y.o. female. Counseled on diet and exercise. Advanced directive form provided today.  Other orders as above. Add TSH w fatigue, if WNL will refer to sleep.  Flu shot today.  Follow up in 6 mo or prn. The patient voiced understanding and agreement to the plan.  [place], DO 08/19/22 1:32 PM

## 2022-08-19 NOTE — Addendum Note (Signed)
Addended by: Kelle Darting A on: 08/19/2022 01:58 PM   Modules accepted: Orders

## 2022-08-19 NOTE — Patient Instructions (Addendum)
Give [place] 2-3 business days to get the results of your labs back.   Keep the diet clean and stay active. Add some weight resistance exercise to your regimen.   Please get me a copy of your advanced directive form at your convenience.   Look up the Mallampati classification.   Let us know if you need anything.

## 2022-08-20 ENCOUNTER — Other Ambulatory Visit: Payer: Self-pay | Admitting: Family Medicine

## 2022-08-20 DIAGNOSIS — R400 Somnolence: Secondary | ICD-10-CM

## 2022-08-20 DIAGNOSIS — R0683 Snoring: Secondary | ICD-10-CM

## 2022-08-20 LAB — COMPREHENSIVE METABOLIC PANEL
AG Ratio: 1.5 (calc) (ref 1.0–2.5)
ALT: 13 U/L (ref 6–29)
AST: 18 U/L (ref 10–30)
Albumin: 4.7 g/dL (ref 3.6–5.1)
Alkaline phosphatase (APISO): 66 U/L (ref 31–125)
BUN: 13 mg/dL (ref 7–25)
CO2: 24 mmol/L (ref 20–32)
Calcium: 9.8 mg/dL (ref 8.6–10.2)
Chloride: 103 mmol/L (ref 98–110)
Creat: 0.85 mg/dL (ref 0.50–0.97)
Globulin: 3.2 g/dL (calc) (ref 1.9–3.7)
Glucose, Bld: 85 mg/dL (ref 65–99)
Potassium: 4.4 mmol/L (ref 3.5–5.3)
Sodium: 138 mmol/L (ref 135–146)
Total Bilirubin: 0.6 mg/dL (ref 0.2–1.2)
Total Protein: 7.9 g/dL (ref 6.1–8.1)

## 2022-08-20 LAB — LIPID PANEL
Cholesterol: 211 mg/dL — ABNORMAL HIGH (ref <200)
HDL: 74 mg/dL (ref >=50)
LDL Cholesterol (Calc): 122 mg/dL (calc) — ABNORMAL HIGH
Non-HDL Cholesterol (Calc): 137 mg/dL (calc) — ABNORMAL HIGH (ref <130)
Total CHOL/HDL Ratio: 2.9 (calc) (ref <5.0)
Triglycerides: 59 mg/dL (ref <150)

## 2022-08-20 LAB — TSH: TSH: 2.07 mIU/L

## 2022-08-20 LAB — CBC
HCT: 39.6 % (ref 35.0–45.0)
Hemoglobin: 12.7 g/dL (ref 11.7–15.5)
MCH: 27.8 pg (ref 27.0–33.0)
MCHC: 32.1 g/dL (ref 32.0–36.0)
MCV: 86.7 fL (ref 80.0–100.0)
MPV: 11.7 fL (ref 7.5–12.5)
Platelets: 250 10*3/uL (ref 140–400)
RBC: 4.57 10*6/uL (ref 3.80–5.10)
RDW: 12.5 % (ref 11.0–15.0)
WBC: 8.5 10*3/uL (ref 3.8–10.8)

## 2022-08-30 ENCOUNTER — Encounter: Payer: Self-pay | Admitting: Family Medicine

## 2022-10-03 IMAGING — US US PELVIS COMPLETE TRANSABD/TRANSVAG W DUPLEX
1 series · 13 of 25 positions shown · non-contrast
Comparison: None.

CLINICAL DATA: Left-sided pelvic pain

EXAM:
TRANSABDOMINAL AND TRANSVAGINAL ULTRASOUND OF PELVIS
DOPPLER ULTRASOUND OF OVARIES
TECHNIQUE: Both transabdominal and transvaginal ultrasound examinations of the
pelvis were performed. Transabdominal technique was performed for
global imaging of the pelvis including uterus, ovaries, adnexal
regions, and pelvic cul-de-sac.
It was necessary to proceed with endovaginal exam following the
transabdominal exam to visualize the endometrium. Color and duplex
Doppler ultrasound was utilized to evaluate blood flow to the
ovaries.

[Series 1: us pelvis complete transabd/transvag w duplex · 13 of 114 slices shown]
[im 1/114]
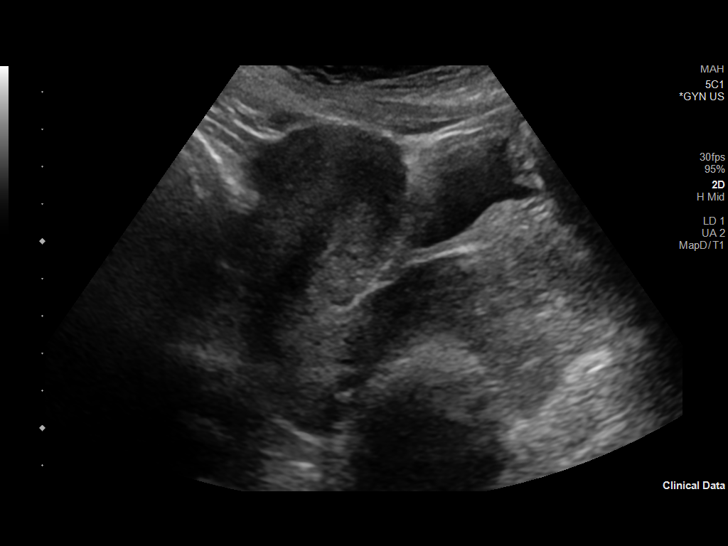
[im 10/114]
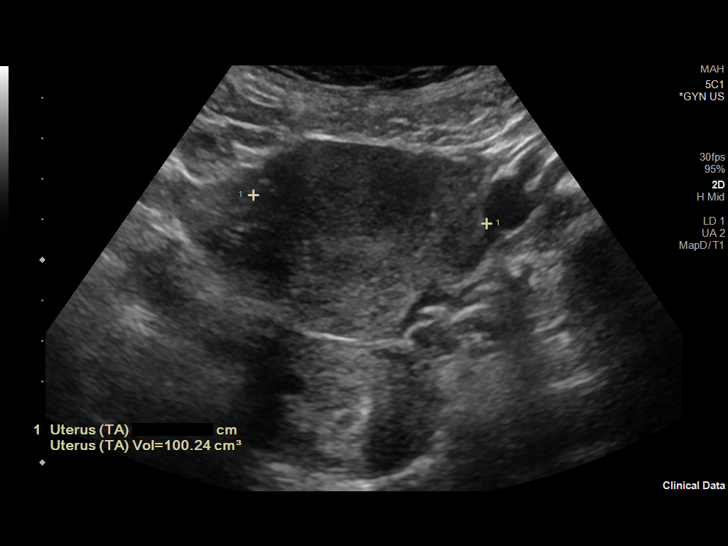
[im 19/114]
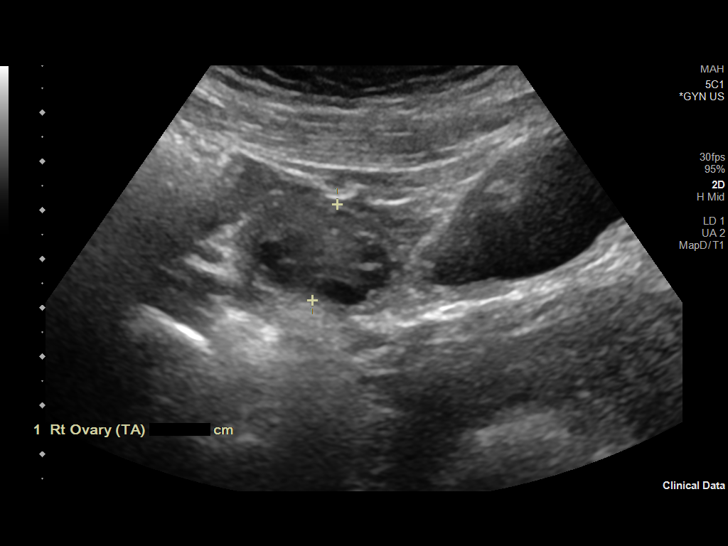
[im 29/114]
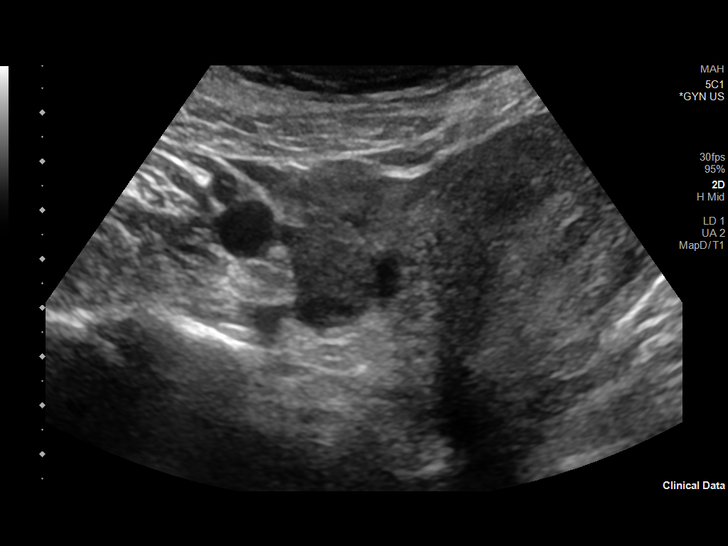
[im 38/114]
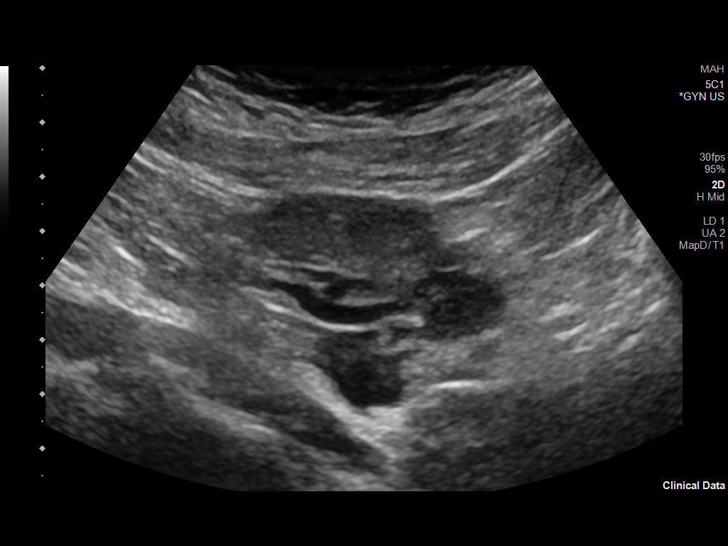
[im 48/114]
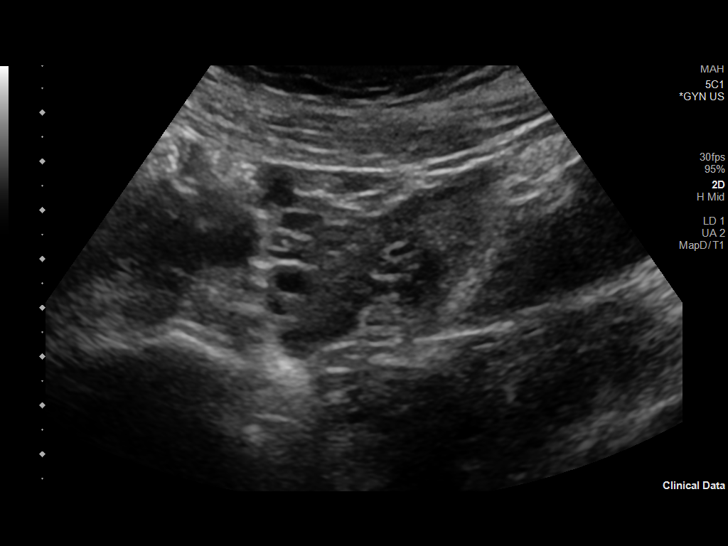
[im 57/114]
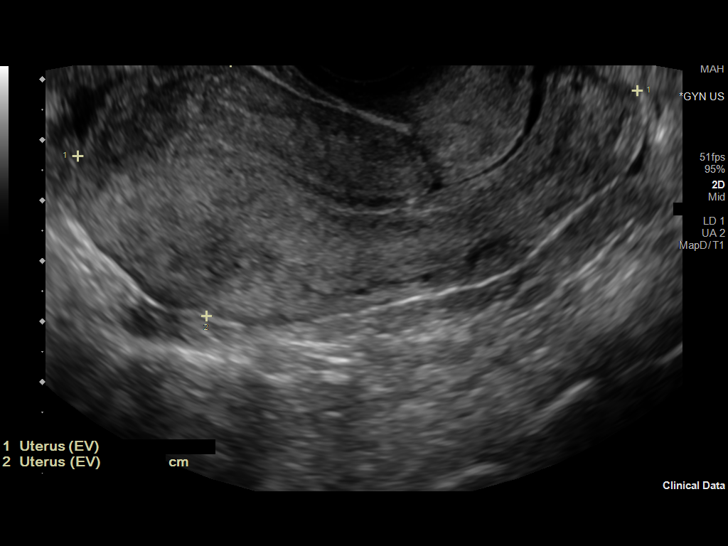
[im 66/114]
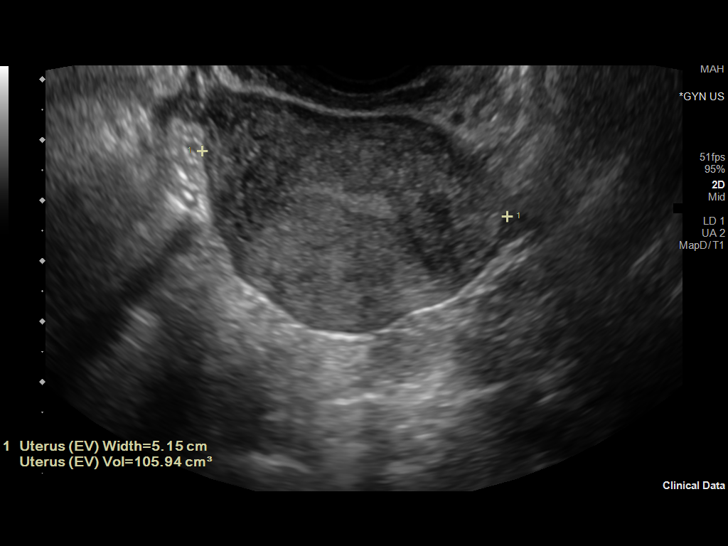
[im 76/114]
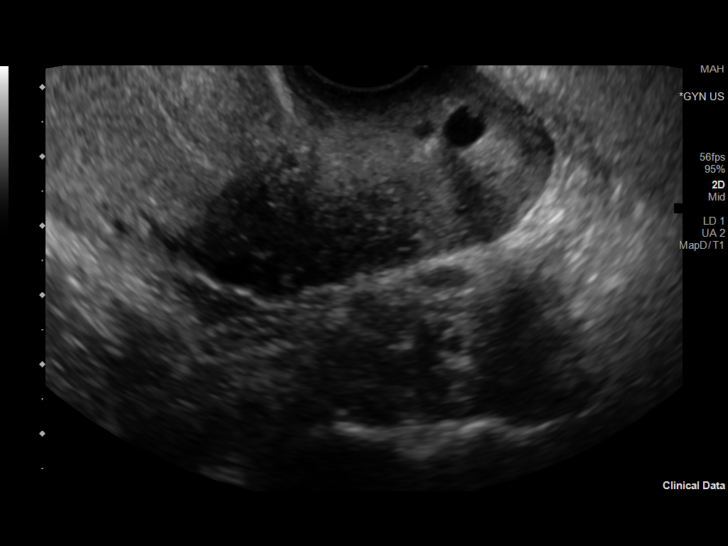
[im 85/114]
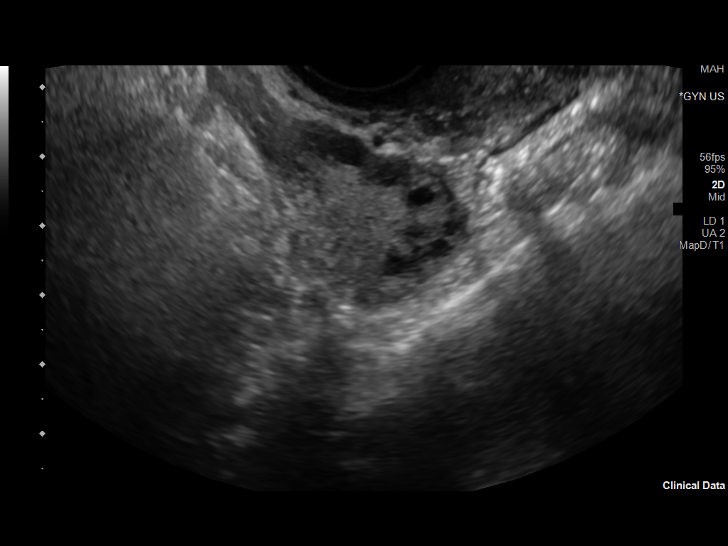
[im 95/114]
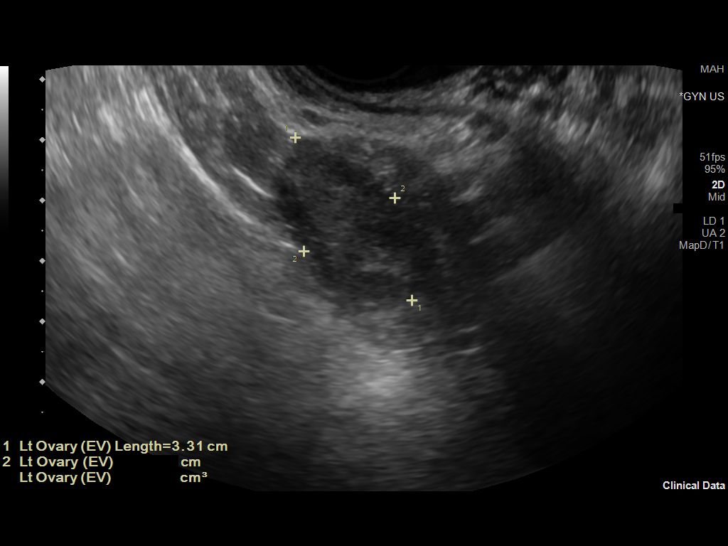
[im 104/114]
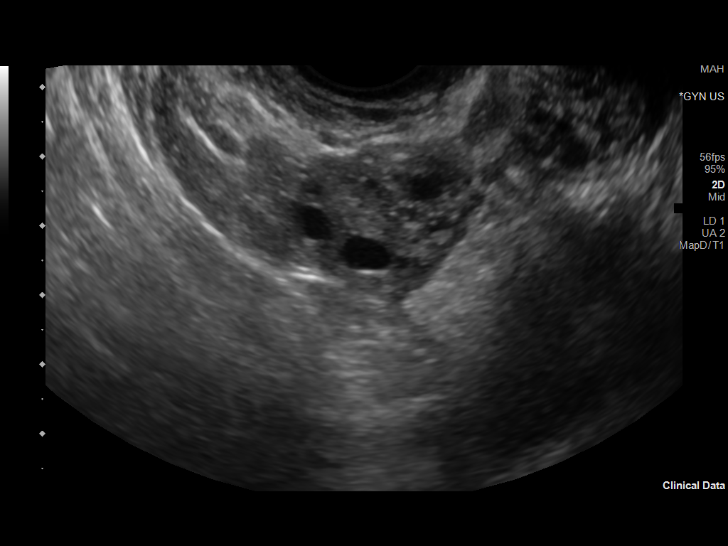
[im 114/114]
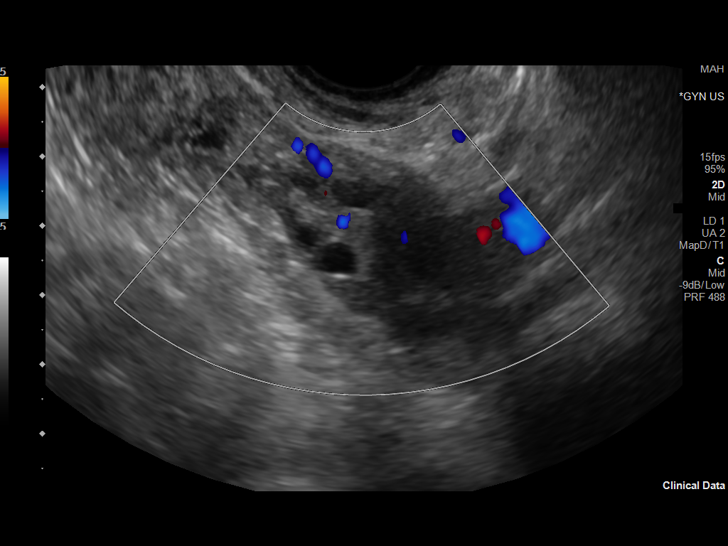

[13 of 25 positions shown; findings below may reference images not displayed]

FINDINGS: Uterus

Measurements: 8.4 x 3.9 x 5.8 cm = volume: 100 mL. The uterus is
anteverted. No intrauterine masses are seen. Defect is seen within
the lower anterior uterine segment in keeping with prior Caesarean
section. The cervix contains a few small nabothian cysts, but is
otherwise unremarkable.

Endometrium

Thickness: 4 mm.  No focal abnormality visualized.

Right ovary

Measurements: 3.2 x 2.3 x 2.5 cm = volume: 9 mL. Normal
appearance/no adnexal mass.

Left ovary

Measurements: 3.3 x 1.7 x 2.5 cm = volume: 7 mL. Normal
appearance/no adnexal mass.

Pulsed Doppler evaluation of both ovaries demonstrates normal
low-resistance arterial and venous waveforms.

Other findings

A trace amount of simple appearing free fluid is seen within the
cul-de-sac and within the right adnexa.
IMPRESSION: Trace free fluid, possibly physiologic. Otherwise normal pelvic
sonogram.

## 2022-10-04 ENCOUNTER — Encounter: Payer: Self-pay | Admitting: Family Medicine

## 2022-10-04 ENCOUNTER — Ambulatory Visit (INDEPENDENT_AMBULATORY_CARE_PROVIDER_SITE_OTHER): Payer: Commercial Managed Care - PPO | Admitting: Family Medicine

## 2022-10-04 VITALS — BP 122/81 | HR 80 | Temp 97.9°F | Ht 61.0 in | Wt 185.5 lb

## 2022-10-04 DIAGNOSIS — J0140 Acute pansinusitis, unspecified: Secondary | ICD-10-CM | POA: Diagnosis not present

## 2022-10-04 LAB — POCT INFLUENZA A/B
Influenza A, POC: NEGATIVE
Influenza B, POC: NEGATIVE

## 2022-10-04 LAB — POC COVID19 BINAXNOW: SARS Coronavirus 2 Ag: NEGATIVE

## 2022-10-04 MED ORDER — FLUCONAZOLE 150 MG PO TABS: ORAL_TABLET | ORAL | 0 refills | Status: DC

## 2022-10-04 MED ORDER — PREDNISONE 20 MG PO TABS: 40.0000 mg | ORAL_TABLET | Freq: Every day | ORAL | 0 refills | Status: AC

## 2022-10-04 MED ORDER — DOXYCYCLINE HYCLATE 100 MG PO TABS: 100.0000 mg | ORAL_TABLET | Freq: Two times a day (BID) | ORAL | 0 refills | Status: DC

## 2022-10-04 NOTE — Patient Instructions (Signed)
OK to take Tylenol 1000 mg (2 extra strength tabs) or 975 mg (3 regular strength tabs) every 6 hours as needed.  Continue to push fluids, practice good hand hygiene, and cover your mouth if you cough.  If you start having fevers, shaking or shortness of breath, seek immediate care.  Wait 2 days before taking the doxycycline and only if no better. If worsening, OK to take it sooner.  Let us know if you need anything.

## 2022-10-04 NOTE — Progress Notes (Signed)
Chief Complaint  Patient presents with   Cough    Sneezing Sore throat Ear pain Sinus pressure Congestion Headache     Kathleen Tanner here for URI complaints.  Duration: 2 days  Associated symptoms: Fever (100.1 F), sinus congestion, sinus pain, rhinorrhea, itchy watery eyes, ear pain, sore throat, myalgia, and coughing, dental pain Denies: ear drainage, wheezing, shortness of breath, and N/V/D, loss of taste/smell Treatment to date: Tylenol, herbal tea, Mucinex Sick contacts: No  Past Medical History:  Diagnosis Date   No known health problems     Objective BP 122/81 (BP Location: Left Arm, Patient Position: Sitting, Cuff Size: Normal)   Pulse 80   Temp 97.9 F (36.6 C) (Oral)   Ht '5\' 1"'$  (1.549 m)   Wt 185 lb 8 oz (84.1 kg)   SpO2 97%   BMI 35.05 kg/m  General: Awake, alert, appears stated age HEENT: AT, [place], ears patent b/l and TM's neg, nares patent w/o discharge, pharynx pink and without exudates, MMM, +ttp over max and frontal sinuses b/l, worse over max Neck: No masses or asymmetry Heart: RRR Lungs: CTAB, no accessory muscle use Psych: Age appropriate judgment and insight, normal mood and affect  Acute pansinusitis, recurrence not specified - Plan: doxycycline (VIBRA-TABS) 100 MG tablet, fluconazole (DIFLUCAN) 150 MG tablet, predniSONE (DELTASONE) 20 MG tablet, POCT Influenza A/B, POC COVID-19  Covid and flu testing neg. 5 d pred burst 40 mg/d, doxy if no better in 2 d. Continue to push fluids, practice good hand hygiene, cover mouth when coughing. F/u prn. If starting to experience fevers, shaking, or shortness of breath, seek immediate care. Pt voiced understanding and agreement to the plan.  [place], DO 10/04/22 2:53 PM

## 2022-10-11 ENCOUNTER — Encounter: Payer: Self-pay | Admitting: Family Medicine

## 2022-10-11 ENCOUNTER — Other Ambulatory Visit: Payer: Self-pay | Admitting: Family Medicine

## 2022-10-14 ENCOUNTER — Ambulatory Visit (INDEPENDENT_AMBULATORY_CARE_PROVIDER_SITE_OTHER): Payer: Commercial Managed Care - PPO | Admitting: Family Medicine

## 2022-10-14 ENCOUNTER — Encounter: Payer: Self-pay | Admitting: Family Medicine

## 2022-10-14 VITALS — BP 130/87 | HR 76 | Temp 98.1°F | Ht 61.0 in | Wt 182.0 lb

## 2022-10-14 DIAGNOSIS — R6889 Other general symptoms and signs: Secondary | ICD-10-CM

## 2022-10-14 DIAGNOSIS — R110 Nausea: Secondary | ICD-10-CM

## 2022-10-14 DIAGNOSIS — R42 Dizziness and giddiness: Secondary | ICD-10-CM

## 2022-10-14 DIAGNOSIS — R790 Abnormal level of blood mineral: Secondary | ICD-10-CM

## 2022-10-14 LAB — IBC + FERRITIN
Ferritin: 34 ng/mL (ref 10.0–291.0)
Iron: 154 ug/dL — ABNORMAL HIGH (ref 42–145)
Saturation Ratios: 38.3 % (ref 20.0–50.0)
TIBC: 401.8 ug/dL (ref 250.0–450.0)
Transferrin: 287 mg/dL (ref 212.0–360.0)

## 2022-10-14 LAB — COMPREHENSIVE METABOLIC PANEL
ALT: 16 U/L (ref 0–35)
AST: 15 U/L (ref 0–37)
Albumin: 4.4 g/dL (ref 3.5–5.2)
Alkaline Phosphatase: 56 U/L (ref 39–117)
BUN: 11 mg/dL (ref 6–23)
CO2: 27 mEq/L (ref 19–32)
Calcium: 9.9 mg/dL (ref 8.4–10.5)
Chloride: 103 mEq/L (ref 96–112)
Creatinine, Ser: 0.84 mg/dL (ref 0.40–1.20)
GFR: 88.7 mL/min (ref >60.00)
Glucose, Bld: 77 mg/dL (ref 70–99)
Potassium: 4.1 mEq/L (ref 3.5–5.1)
Sodium: 137 mEq/L (ref 135–145)
Total Bilirubin: 0.6 mg/dL (ref 0.2–1.2)
Total Protein: 7.6 g/dL (ref 6.0–8.3)

## 2022-10-14 LAB — CBC
HCT: 41.1 % (ref 36.0–46.0)
Hemoglobin: 13.2 g/dL (ref 12.0–15.0)
MCHC: 32 g/dL (ref 30.0–36.0)
MCV: 87.7 fl (ref 78.0–100.0)
Platelets: 239 10*3/uL (ref 150.0–400.0)
RBC: 4.69 Mil/uL (ref 3.87–5.11)
RDW: 14.3 % (ref 11.5–15.5)
WBC: 9.1 10*3/uL (ref 4.0–10.5)

## 2022-10-14 LAB — TSH: TSH: 2.47 u[IU]/mL (ref 0.35–5.50)

## 2022-10-14 MED ORDER — ONDANSETRON 4 MG PO TBDP: 4.0000 mg | ORAL_TABLET | Freq: Three times a day (TID) | ORAL | 0 refills | Status: DC | PRN

## 2022-10-14 NOTE — Patient Instructions (Signed)
Give [place] 2-3 business days to get the results of your labs back.   Stay hydrated.  Consider compression stockings.  Consider fluids w electrolytes.   Let us know if you need anything.

## 2022-10-14 NOTE — Progress Notes (Signed)
Chief Complaint  Patient presents with   Follow-up    Sinus infection is better Something is making her lightheaded, sob and hot    Kathleen Tanner is 37 y.o. pt here for dizziness.  Duration: 1 week Pass out? No Spinning? Yes; and lightheadedness associated with position changes from seated to standing/sitting up from laying down.  Recent illness/fever? Yes Headache? Yes; relatively constant Feeling hot most of time as well, but no fevers. Some SOB.  Pangs of pain over frontal sinuses and area under tongue Neurologic signs? No Change in PO intake? No Palpitations? No Started on pred and doxy, stopped 5 d ago and not improving.  No alcohol or drug use.   Past Medical History:  Diagnosis Date   No known health problems     Family History  Problem Relation Age of Onset   Colon cancer Mother 25    Allergies as of 10/14/2022       Reactions   Ciprofloxacin    Doxycycline Other (See Comments)   Flushing, lightheaded   Naproxen    Tape         Medication List        Accurate as of October 14, 2022  1:09 PM. If you have any questions, ask your nurse or doctor.          citalopram 20 MG tablet Commonly known as: CELEXA Take 1 tablet (20 mg total) by mouth daily.   doxepin 10 MG capsule Commonly known as: SINEQUAN Take 1 capsule (10 mg total) by mouth at bedtime as needed.   fluconazole 150 MG tablet Commonly known as: DIFLUCAN Take 1 tab, repeat in 72 hours if no improvement.   MULTIVITAMIN WOMEN PO Take by mouth.   ondansetron 4 MG disintegrating tablet Commonly known as: ZOFRAN-ODT Take 1 tablet (4 mg total) by mouth every 8 (eight) hours as needed for nausea or vomiting. Started by: Shelda Pal, DO   triamcinolone cream 0.1 % Commonly known as: KENALOG Apply 1 Application topically 2 (two) times daily.        BP 130/87 (BP Location: Left Arm, Patient Position: Sitting, Cuff Size: Normal)   Pulse 76   Temp 98.1 F (36.7 C) (Oral)   Ht  '5\' 1"'$  (1.549 m)   Wt 182 lb (82.6 kg)   SpO2 99%   BMI 34.39 kg/m  General: Awake, alert, appears stated age Eyes: PERRLA, EOMi Ears: Patent, TM's neg b/l Heart: RRR, no murmurs, no carotid bruits Lungs: CTAB, no accessory muscle use MSK: 5/5 strength throughout, gait normal Neuro: No cerebellar signs, patellar reflex 2/4 b/l wo clonus, calcaneal reflex 0/4 b/l wo clonus, biceps reflex 1/4 b/l wo clonus; Dix-[place]-P[place] negative b/l. Psych: Age appropriate judgment and insight, normal mood and affect  Lightheadedness - Plan: CBC, Comprehensive metabolic panel  Nausea - Plan: ondansetron (ZOFRAN-ODT) 4 MG disintegrating tablet  Heat intolerance - Plan: TSH, T4, free  Low ferritin - Plan: IBC + Ferritin  Ck labs. Would think if an adverse drug reaction, it would be improving by now.  Consider compression stockings.  Consider fluids with electrolytes.  Will consider cardiology referral if no improvement.  Could consider imaging. F/u prn. Pt voiced understanding and agreement to the plan.  [place], DO 10/14/22 1:09 PM

## 2022-10-15 LAB — T4, FREE: Free T4: 0.61 ng/dL (ref 0.60–1.60)

## 2022-10-19 ENCOUNTER — Ambulatory Visit (INDEPENDENT_AMBULATORY_CARE_PROVIDER_SITE_OTHER): Payer: Commercial Managed Care - PPO

## 2022-10-19 DIAGNOSIS — R079 Chest pain, unspecified: Secondary | ICD-10-CM | POA: Diagnosis not present

## 2022-10-19 NOTE — Progress Notes (Signed)
Pt here for EKG per Dr.Wendling  Dizziness and light head, Chest pain    EKG: normal EKG, normal sinus rhythm, unchanged from previous tracings.  Pt Advise per: Dr.Wendling referral to Cardiology for further evaluation and Pt was advised Of referral to Cardiology and advised if symptom worsen to go to ER.

## 2022-10-27 ENCOUNTER — Telehealth: Payer: Self-pay | Admitting: Family Medicine

## 2022-10-27 ENCOUNTER — Encounter: Payer: Self-pay | Admitting: Family Medicine

## 2022-10-27 DIAGNOSIS — R079 Chest pain, unspecified: Secondary | ICD-10-CM

## 2022-10-27 NOTE — Telephone Encounter (Signed)
Referral placed.

## 2022-10-27 NOTE — Telephone Encounter (Signed)
Pt called to follow up on the cardiology referral. Didn't see one placed so advised I would send back to CMA to follow up. Please call to advise.

## 2022-10-27 NOTE — Addendum Note (Signed)
Addended byDamita Dunnings D on: 10/27/2022 03:29 PM   Modules accepted: Orders

## 2022-11-23 ENCOUNTER — Ambulatory Visit: Payer: Commercial Managed Care - PPO | Attending: Internal Medicine | Admitting: Internal Medicine

## 2022-11-23 ENCOUNTER — Encounter: Payer: Self-pay | Admitting: Internal Medicine

## 2022-11-23 VITALS — BP 118/80 | HR 86 | Ht 62.0 in | Wt 190.2 lb

## 2022-11-23 DIAGNOSIS — E785 Hyperlipidemia, unspecified: Secondary | ICD-10-CM

## 2022-11-23 DIAGNOSIS — Z8249 Family history of ischemic heart disease and other diseases of the circulatory system: Secondary | ICD-10-CM

## 2022-11-23 DIAGNOSIS — R55 Syncope and collapse: Secondary | ICD-10-CM

## 2022-11-23 DIAGNOSIS — R079 Chest pain, unspecified: Secondary | ICD-10-CM

## 2022-11-23 NOTE — Progress Notes (Signed)
Cardiology Office Note:    Date:  11/23/2022   ID:  Kathleen Tanner, DOB Jun 10, 1986, MRN [id number]  PCP:  Sharlene Dory, DO   [facility] Providers Cardiologist:  Christell Constant, MD     Referring MD: Sharlene Dory*   CC: Chest pain Consulted for the evaluation of chest pain at the behest of Dr. Carmelia Roller  History of Present Illness:    Kathleen Tanner is a 37 y.o. female with a hx of HLD who presents with chest pain.  Patient notes that she had a sinus infection back In March.   Took an antibiotic and had a terrible feeling after. Has nausea. Near dizziness and near syncope with any movement. Had vibrations in the setting in her chest.  She notes that her sister has morbid obesity.  She has gained weight as she is less comfortable going to the gym given concerns for the constellation of her symptoms.  Has rare chest pain not with exertion.  No shortness of breath, but does not DOE with limited exercise.  No PND or orthopnea.  No weight gain, leg swelling , or abdominal swelling.  Dizziness has improved some. Notes  no palpitations or funny heart beats.     Past Medical History:  Diagnosis Date   No known health problems     Past Surgical History:  Procedure Laterality Date   CESAREAN SECTION      Current Medications: Current Meds  Medication Sig   citalopram (CELEXA) 20 MG tablet Take 1 tablet (20 mg total) by mouth daily.   Multiple Vitamins-Minerals (MULTIVITAMIN WOMEN PO) Take by mouth.   ondansetron (ZOFRAN-ODT) 4 MG disintegrating tablet Take 1 tablet (4 mg total) by mouth every 8 (eight) hours as needed for nausea or vomiting.   triamcinolone cream (KENALOG) 0.1 % Apply 1 Application topically 2 (two) times daily. (Patient taking differently: Apply 1 Application topically as needed (rash).)     Allergies:   Ciprofloxacin, Doxycycline, Naproxen, and Tape   Social History   Socioeconomic History   Marital status: Single    Spouse  name: Not on file   Number of children: Not on file   Years of education: Not on file   Highest education level: Not on file  Occupational History   Not on file  Tobacco Use   Smoking status: Never   Smokeless tobacco: Never  Vaping Use   Vaping Use: Never used  Substance and Sexual Activity   Alcohol use: Yes    Alcohol/week: 2.0 - 4.0 standard drinks of alcohol    Types: 2 - 4 Standard drinks or equivalent per week    Comment: daily   Drug use: Never   Sexual activity: Not on file  Other Topics Concern   Not on file  Social History Narrative   Not on file   Social Determinants of Health   Financial Resource Strain: Not on file  Food Insecurity: Not on file  Transportation Needs: Not on file  Physical Activity: Not on file  Stress: Not on file  Social Connections: Not on file     Family History: The patient's family history includes Colon cancer (age of onset: 62) in her mother. Father had MI at the age of 11.  ROS:   Please see the history of present illness.     EKGs/Labs/Other Studies Reviewed:    The following studies were reviewed today:  EKG:  EKG is  ordered today.  The ekg ordered today demonstrates  10/19/22: SR  Recent Labs: 10/14/2022: ALT 16; BUN 11; Creatinine, Ser 0.84; Hemoglobin 13.2; Platelets 239.0; Potassium 4.1; Sodium 137; TSH 2.47  Recent Lipid Panel    Component Value Date/Time   CHOL 211 (H) 08/19/2022 1357   TRIG 59 08/19/2022 1357   HDL 74 08/19/2022 1357   CHOLHDL 2.9 08/19/2022 1357   VLDL 16.6 07/23/2021 1317   LDLCALC 122 (H) 08/19/2022 1357        Physical Exam:    VS:  BP 118/80   Pulse 86   Ht  (1.575 m)   Wt 190 lb 3.2 oz (86.3 kg)   SpO2 98%   BMI 34.79 kg/m     Wt Readings from Last 3 Encounters:  11/23/22 190 lb 3.2 oz (86.3 kg)  10/14/22 182 lb (82.6 kg)  10/04/22 185 lb 8 oz (84.1 kg)    GEN:  Well nourished, well developed in no acute distress HEENT: Normal NECK: No JVD CARDIAC: RRR, no murmurs,  rubs, gallops RESPIRATORY:  Clear to auscultation without rales, wheezing or rhonchi  ABDOMEN: Soft, non-tender, non-distended MUSCULOSKELETAL:  No edema; No deformity  SKIN: Warm and dry NEUROLOGIC:  Alert and oriented x 3 PSYCHIATRIC:  Normal affect   ASSESSMENT:    1. Chest pain of uncertain etiology   2. Near syncope   3. Hyperlipidemia, unspecified hyperlipidemia type    PLAN:    Chest pain Family hx of early CAD (father) - will get POET: if negative no barriers to restarting exercise  Near syncope after sinus injection - Echo to r/u cardiac etiology - suspect noncardiac  HLD - offer Lp(a) - if POET negative no barriers to resume exercise  PRN f/u        Shared Decision Making/Informed Consent The risks [chest pain, shortness of breath, cardiac arrhythmias, dizziness, blood pressure fluctuations, myocardial infarction, stroke/transient ischemic attack, and life-threatening complications (estimated to be 1 in 10,000)], benefits (risk stratification, diagnosing coronary artery disease, treatment guidance) and alternatives of an exercise tolerance test were discussed in detail with Kathleen Tanner and she agrees to proceed.    Medication Adjustments/Labs and Tests Ordered: Current medicines are reviewed at length with the patient today.  Concerns regarding medicines are outlined above.  Orders Placed This Encounter  Procedures   Lipoprotein A (LPA)   Cardiac Stress Test: Informed Consent Details: Physician/Practitioner Attestation; Transcribe to consent form and obtain patient signature   EXERCISE TOLERANCE TEST (ETT)   ECHOCARDIOGRAM COMPLETE   No orders of the defined types were placed in this encounter.   Patient Instructions  Medication Instructions:  Your physician recommends that you continue on your current medications as directed. Please refer to the Current Medication list given to you today.  *If you need a refill on your cardiac medications before your next  appointment, please call your pharmacy*   Lab Work: TODAY: Lipoprotein A  If you have labs (blood work) drawn today and your tests are completely normal, you will receive your results only by: MyChart Message (if you have MyChart) OR A paper copy in the mail If you have any lab test that is abnormal or we need to change your treatment, we will call you to review the results.   Testing/Procedures: Your physician has requested that you have an exercise tolerance test. For further information please visit [email or web address] Please also follow instruction sheet, as given.  Your physician has requested that you have an echocardiogram. Echocardiography is a painless test that uses sound waves to create  images of your heart. It provides your doctor with information about the size and shape of your heart and how well your heart's chambers and valves are working. This procedure takes approximately one hour. There are no restrictions for this procedure. Please do NOT wear cologne, perfume, aftershave, or lotions (deodorant is allowed). Please arrive 15 minutes prior to your appointment time.    Follow-Up:As Needed At [facility], you and your health needs are our priority.  As part of our continuing mission to provide you with exceptional heart care, we have created designated Provider Care Teams.  These Care Teams include your primary Cardiologist (physician) and Advanced Practice Providers (APPs -  Physician Assistants and Nurse Practitioners) who all work together to provide you with the care you need, when you need it.   Provider:   Christell Constant, MD       Signed, Christell Constant, MD  11/23/2022 9:35 AM    [facility]

## 2022-11-23 NOTE — Patient Instructions (Signed)
Medication Instructions:  Your physician recommends that you continue on your current medications as directed. Please refer to the Current Medication list given to you today.  *If you need a refill on your cardiac medications before your next appointment, please call your pharmacy*   Lab Work: TODAY: Lipoprotein A  If you have labs (blood work) drawn today and your tests are completely normal, you will receive your results only by: MyChart Message (if you have MyChart) OR A paper copy in the mail If you have any lab test that is abnormal or we need to change your treatment, we will call you to review the results.   Testing/Procedures: Your physician has requested that you have an exercise tolerance test. For further information please visit [email or web address] Please also follow instruction sheet, as given.  Your physician has requested that you have an echocardiogram. Echocardiography is a painless test that uses sound waves to create images of your heart. It provides your doctor with information about the size and shape of your heart and how well your heart's chambers and valves are working. This procedure takes approximately one hour. There are no restrictions for this procedure. Please do NOT wear cologne, perfume, aftershave, or lotions (deodorant is allowed). Please arrive 15 minutes prior to your appointment time.    Follow-Up:As Needed At [facility], you and your health needs are our priority.  As part of our continuing mission to provide you with exceptional heart care, we have created designated Provider Care Teams.  These Care Teams include your primary Cardiologist (physician) and Advanced Practice Providers (APPs -  Physician Assistants and Nurse Practitioners) who all work together to provide you with the care you need, when you need it.   Provider:   Christell Constant, MD

## 2022-11-25 ENCOUNTER — Telehealth: Payer: Self-pay

## 2022-11-25 DIAGNOSIS — Z8249 Family history of ischemic heart disease and other diseases of the circulatory system: Secondary | ICD-10-CM

## 2022-11-25 DIAGNOSIS — E785 Hyperlipidemia, unspecified: Secondary | ICD-10-CM

## 2022-11-25 LAB — LIPOPROTEIN A (LPA): Lipoprotein (a): 165.1 nmol/L — ABNORMAL HIGH (ref <75.0)

## 2022-11-25 MED ORDER — ROSUVASTATIN CALCIUM 5 MG PO TABS: 5.0000 mg | ORAL_TABLET | Freq: Every day | ORAL | 3 refills | Status: DC

## 2022-11-25 NOTE — Telephone Encounter (Signed)
-----   Message from Christell Constant, MD sent at 11/25/2022  8:03 AM EDT ----- Results: Elevated Lp(a) Plan: Rosuvastatin 5 mg; labs in three momths  Christell Constant, MD

## 2022-11-25 NOTE — Telephone Encounter (Signed)
The patient has been notified of the result and verbalized understanding.  All questions (if any) were answered. Macie Burows, RN 11/25/2022 10:54 AM   Pt will come have f/u labs drawn on 02/24/23.

## 2022-12-06 ENCOUNTER — Other Ambulatory Visit: Payer: Self-pay | Admitting: Family Medicine

## 2022-12-06 DIAGNOSIS — J0140 Acute pansinusitis, unspecified: Secondary | ICD-10-CM

## 2022-12-08 ENCOUNTER — Ambulatory Visit: Payer: Commercial Managed Care - PPO | Admitting: Internal Medicine

## 2022-12-15 ENCOUNTER — Ambulatory Visit: Payer: Commercial Managed Care - PPO | Attending: Internal Medicine

## 2022-12-15 ENCOUNTER — Ambulatory Visit (HOSPITAL_BASED_OUTPATIENT_CLINIC_OR_DEPARTMENT_OTHER): Payer: Commercial Managed Care - PPO

## 2022-12-15 DIAGNOSIS — E785 Hyperlipidemia, unspecified: Secondary | ICD-10-CM | POA: Diagnosis present

## 2022-12-15 DIAGNOSIS — R55 Syncope and collapse: Secondary | ICD-10-CM

## 2022-12-15 DIAGNOSIS — R079 Chest pain, unspecified: Secondary | ICD-10-CM

## 2022-12-15 LAB — ECHOCARDIOGRAM COMPLETE
Area-P 1/2: 3.42 cm2
S' Lateral: 2.7 cm

## 2022-12-15 LAB — EXERCISE TOLERANCE TEST
Angina Index: 0
Estimated workload: 8.4
Exercise duration (sec): 57 s
RPE: 17

## 2022-12-16 ENCOUNTER — Other Ambulatory Visit: Payer: Self-pay | Admitting: Family Medicine

## 2022-12-16 ENCOUNTER — Encounter: Payer: Self-pay | Admitting: Family Medicine

## 2022-12-16 DIAGNOSIS — R42 Dizziness and giddiness: Secondary | ICD-10-CM

## 2022-12-16 LAB — EXERCISE TOLERANCE TEST
Duke Treadmill Score: 7
Exercise duration (min): 6 min
MPHR: 183 {beats}/min
Peak HR: 181 {beats}/min
Percent HR: 98 %
Rest HR: 74 {beats}/min
ST Depression (mm): 0 mm

## 2023-01-30 ENCOUNTER — Other Ambulatory Visit: Payer: Self-pay | Admitting: Family Medicine

## 2023-01-30 DIAGNOSIS — F411 Generalized anxiety disorder: Secondary | ICD-10-CM

## 2023-01-30 DIAGNOSIS — G4700 Insomnia, unspecified: Secondary | ICD-10-CM

## 2023-01-30 MED ORDER — CITALOPRAM HYDROBROMIDE 20 MG PO TABS
20.0000 mg | ORAL_TABLET | Freq: Every day | ORAL | 2 refills | Status: DC
Start: 2023-01-30 — End: 2023-10-03

## 2023-02-17 ENCOUNTER — Encounter: Payer: Self-pay | Admitting: Family Medicine

## 2023-02-17 ENCOUNTER — Ambulatory Visit (INDEPENDENT_AMBULATORY_CARE_PROVIDER_SITE_OTHER): Payer: Commercial Managed Care - PPO | Admitting: Family Medicine

## 2023-02-17 ENCOUNTER — Ambulatory Visit: Payer: Commercial Managed Care - PPO | Admitting: Family Medicine

## 2023-02-17 VITALS — BP 108/76 | HR 75 | Temp 98.4°F | Ht 61.0 in | Wt 187.2 lb

## 2023-02-17 DIAGNOSIS — F411 Generalized anxiety disorder: Secondary | ICD-10-CM

## 2023-02-17 MED ORDER — BUSPIRONE HCL 7.5 MG PO TABS
7.5000 mg | ORAL_TABLET | Freq: Two times a day (BID) | ORAL | 1 refills | Status: DC
Start: 2023-02-17 — End: 2023-10-03

## 2023-02-17 NOTE — Patient Instructions (Addendum)
Keep the diet clean and stay active.  Let us know if you need anything. 

## 2023-02-17 NOTE — Progress Notes (Signed)
Chief Complaint  Patient presents with   Follow-up    Subjective Kathleen Tanner presents for f/u anxiety.  Pt is currently being treated with Celexa 20 mg/d.  Reports some racing thoughts since treatment. No thoughts of harming self or others. No self-medication with alcohol, prescription drugs or illicit drugs. Pt is following with a counselor/psychologist.  Past Medical History:  Diagnosis Date   GAD (generalized anxiety disorder)    Hyperlipidemia    Allergies as of 02/17/2023       Reactions   Ciprofloxacin    Doxycycline Other (See Comments)   Flushing, lightheaded   Naproxen    Tape         Medication List        Accurate as of February 17, 2023  4:08 PM. If you have any questions, ask your nurse or doctor.          STOP taking these medications    ondansetron 4 MG disintegrating tablet Commonly known as: ZOFRAN-ODT Stopped by: Sharlene Dory       TAKE these medications    busPIRone 7.5 MG tablet Commonly known as: BUSPAR Take 1 tablet (7.5 mg total) by mouth 2 (two) times daily. Started by: Sharlene Dory   citalopram 20 MG tablet Commonly known as: CELEXA Take 1 tablet (20 mg total) by mouth daily.   fluconazole 150 MG tablet Commonly known as: DIFLUCAN TAKE 1 TABLET BY MOUTH ONCE FOR 1 DOSE, AND REPEAT IN 72 HOURS IF NO IMPROVEMENT   MULTIVITAMIN WOMEN PO Take by mouth.   rosuvastatin 5 MG tablet Commonly known as: CRESTOR Take 1 tablet (5 mg total) by mouth daily.   triamcinolone cream 0.1 % Commonly known as: KENALOG Apply 1 Application topically 2 (two) times daily. What changed:  when to take this reasons to take this        Exam BP 108/76 (BP Location: Left Arm, Patient Position: Sitting, Cuff Size: Normal)   Pulse 75   Temp 98.4 F (36.9 C) (Oral)   Ht 5\' 1"  (1.549 m)   Wt 187 lb 4 oz (84.9 kg)   SpO2 99%   BMI 35.38 kg/m  General:  well developed, well nourished, in no apparent distress Lungs:  No  respiratory distress Psych: well oriented with normal range of affect and age-appropriate judgement/insight, alert and oriented x4.  Assessment and Plan  GAD (generalized anxiety disorder) - Plan: busPIRone (BUSPAR) 7.5 MG tablet  Chronic, unstable. Cont Celexa 20 mg/d. Add BuSpar 7.5 mg 1-2 times daily. Cont w counseling team.  Tentatively f/u in 6 mo for CPE, sooner if above plan does not work.  The patient voiced understanding and agreement to the plan.  Jilda Roche [place], DO 02/17/23 4:08 PM

## 2023-02-24 ENCOUNTER — Ambulatory Visit: Payer: Commercial Managed Care - PPO | Attending: Internal Medicine

## 2023-03-24 ENCOUNTER — Encounter: Payer: Self-pay | Admitting: Family Medicine

## 2023-07-11 ENCOUNTER — Other Ambulatory Visit: Payer: Self-pay | Admitting: Family Medicine

## 2023-07-11 DIAGNOSIS — J0140 Acute pansinusitis, unspecified: Secondary | ICD-10-CM

## 2023-08-25 ENCOUNTER — Encounter: Payer: Commercial Managed Care - PPO | Admitting: Family Medicine

## 2023-10-02 ENCOUNTER — Encounter: Payer: Self-pay | Admitting: Family Medicine

## 2023-10-02 ENCOUNTER — Telehealth (INDEPENDENT_AMBULATORY_CARE_PROVIDER_SITE_OTHER): Payer: Commercial Managed Care - PPO | Admitting: Family Medicine

## 2023-10-02 DIAGNOSIS — R509 Fever, unspecified: Secondary | ICD-10-CM | POA: Diagnosis not present

## 2023-10-02 MED ORDER — PROMETHAZINE-DM 6.25-15 MG/5ML PO SYRP: 5.0000 mL | ORAL_SOLUTION | Freq: Four times a day (QID) | ORAL | 0 refills | Status: AC | PRN

## 2023-10-02 MED ORDER — FLUCONAZOLE 150 MG PO TABS: ORAL_TABLET | ORAL | 0 refills | Status: DC

## 2023-10-02 MED ORDER — AZITHROMYCIN 250 MG PO TABS: ORAL_TABLET | ORAL | 0 refills | Status: DC

## 2023-10-02 NOTE — Progress Notes (Signed)
 Chief Complaint  Patient presents with   Sinus Problem    Kathleen Tanner here for URI complaints. We are interacting via web portal for an electronic face-to-face visit. I verified patient's ID using 2 identifiers. Patient agreed to proceed with visit via this method. Patient is at home, I am at office. Patient and I are present for visit.   Duration: 3 days  Associated symptoms: subjective fever, sinus congestion, ear pain, myalgia, and coughing, chest congestion Denies: sinus pain, rhinorrhea, itchy watery eyes, ear drainage, sore throat, wheezing, shortness of breath, and N/V/D Treatment to date: Sudafed, Alka-seltzer cold/flu Sick contacts: No  Past Medical History:  Diagnosis Date   GAD (generalized anxiety disorder)    Hyperlipidemia     Objective No conversational dyspnea Age appropriate judgment and insight Nml affect and mood  Febrile illness - Plan: fluconazole (DIFLUCAN) 150 MG tablet, azithromycin (ZITHROMAX) 250 MG tablet, promethazine-dextromethorphan (PROMETHAZINE-DM) 6.25-15 MG/5ML syrup  Ck for flu and covid at home. If +, let me know. With the febrile illness, will cover for bacterial infection w a Zpak, Diflucan prn. Warnings about drowsiness with cough syrup verbalized. Continue to push fluids, practice good hand hygiene, cover mouth when coughing. F/u prn. If starting to experience fevers, shaking, or shortness of breath, seek immediate care. Pt voiced understanding and agreement to the plan.  Jilda Roche [place], DO 10/02/23 12:12 PM

## 2023-10-03 ENCOUNTER — Other Ambulatory Visit: Payer: Self-pay | Admitting: Family Medicine

## 2023-11-25 ENCOUNTER — Other Ambulatory Visit: Payer: Self-pay | Admitting: Internal Medicine
